# Patient Record
Sex: Female | Born: 1975 | Race: Black or African American | Hispanic: No | Marital: Single | Smoking: Never smoker
Health system: Southern US, Community
[De-identification: ages and names within clinical notes are randomized; demographics above are authoritative.]

## PROBLEM LIST (undated history)

## (undated) DIAGNOSIS — K219 Gastro-esophageal reflux disease without esophagitis: Secondary | ICD-10-CM

## (undated) HISTORY — PX: DILATION AND CURETTAGE OF UTERUS: SHX78

## (undated) HISTORY — PX: EYE SURGERY: SHX253

## (undated) HISTORY — PX: OTHER SURGICAL HISTORY: SHX169

---

## 2010-10-23 ENCOUNTER — Other Ambulatory Visit: Payer: Self-pay | Admitting: Obstetrics

## 2010-10-23 DIAGNOSIS — N83209 Unspecified ovarian cyst, unspecified side: Secondary | ICD-10-CM

## 2010-10-30 ENCOUNTER — Other Ambulatory Visit (HOSPITAL_COMMUNITY): Payer: Self-pay

## 2010-10-30 ENCOUNTER — Ambulatory Visit (HOSPITAL_COMMUNITY): Payer: Medicaid Other

## 2011-06-27 ENCOUNTER — Emergency Department (HOSPITAL_COMMUNITY): Payer: Medicaid Other

## 2011-06-27 ENCOUNTER — Encounter (HOSPITAL_COMMUNITY): Payer: Self-pay

## 2011-06-27 ENCOUNTER — Emergency Department (HOSPITAL_COMMUNITY)
Admission: EM | Admit: 2011-06-27 | Discharge: 2011-06-27 | Disposition: A | Discharge status: Home / Self Care | Payer: Medicaid Other | Attending: Emergency Medicine | Admitting: Emergency Medicine

## 2011-06-27 DIAGNOSIS — M545 Low back pain, unspecified: Secondary | ICD-10-CM | POA: Insufficient documentation

## 2011-06-27 DIAGNOSIS — M542 Cervicalgia: Secondary | ICD-10-CM | POA: Insufficient documentation

## 2011-06-27 DIAGNOSIS — J111 Influenza due to unidentified influenza virus with other respiratory manifestations: Secondary | ICD-10-CM

## 2011-06-27 DIAGNOSIS — R05 Cough: Secondary | ICD-10-CM | POA: Insufficient documentation

## 2011-06-27 DIAGNOSIS — R059 Cough, unspecified: Secondary | ICD-10-CM | POA: Insufficient documentation

## 2011-06-27 DIAGNOSIS — J3489 Other specified disorders of nose and nasal sinuses: Secondary | ICD-10-CM | POA: Insufficient documentation

## 2011-06-27 DIAGNOSIS — R509 Fever, unspecified: Secondary | ICD-10-CM | POA: Insufficient documentation

## 2011-06-27 DIAGNOSIS — R5381 Other malaise: Secondary | ICD-10-CM | POA: Insufficient documentation

## 2011-06-27 DIAGNOSIS — R5383 Other fatigue: Secondary | ICD-10-CM | POA: Insufficient documentation

## 2011-06-27 DIAGNOSIS — N390 Urinary tract infection, site not specified: Secondary | ICD-10-CM

## 2011-06-27 DIAGNOSIS — H9209 Otalgia, unspecified ear: Secondary | ICD-10-CM | POA: Insufficient documentation

## 2011-06-27 DIAGNOSIS — R51 Headache: Secondary | ICD-10-CM | POA: Insufficient documentation

## 2011-06-27 LAB — URINE MICROSCOPIC-ADD ON

## 2011-06-27 LAB — URINALYSIS, ROUTINE W REFLEX MICROSCOPIC
Ketones, ur: 15 mg/dL — AB
Nitrite: NEGATIVE
Protein, ur: NEGATIVE mg/dL
Urobilinogen, UA: 0.2 mg/dL (ref 0.0–1.0)
pH: 6 (ref 5.0–8.0)

## 2011-06-27 MED ORDER — ACETAMINOPHEN 325 MG PO TABS
650.0000 mg | ORAL_TABLET | Freq: Once | ORAL | Status: AC
  Administered 2011-06-27: 650 mg via ORAL
  Filled 2011-06-27: qty 2

## 2011-06-27 MED ORDER — SULFAMETHOXAZOLE-TRIMETHOPRIM 800-160 MG PO TABS: 1.0000 | ORAL_TABLET | Freq: Two times a day (BID) | ORAL | Status: AC

## 2011-06-27 NOTE — ED Notes (Signed)
MD at bedside. 

## 2011-06-27 NOTE — ED Notes (Signed)
Pt states that for the past few days she has been having cold s/s consistent with uri. She states that she took her temperature last night and it was 101. She states that she feels congested in her head and sinuses. She states that when she was at work and was moving her head and neck about she states that she had severe pain in her neck going up into her head. She was unable to sleep last night due to the malaise. Pt denies any nausea. She states that she felt way worse yesterday but what has changed today is the neck pain. She last took advil this morning at 7am.

## 2011-06-27 NOTE — ED Provider Notes (Signed)
History     CSN: [id number]  Arrival date & time 06/27/11  1205   First MD Initiated Contact with Patient 06/27/11 1226      Chief Complaint  Patient presents with  . Fever  . Neck Pain  . URI   patient essentially states that she began feeling sick 3 days ago on Tuesday. At that time she began having some ear discomfort nasal congestion, headache. She also had pain from her neck down into her lower back. She's had sinus congestion and also has had a cough. She has had generalized malaise and states the back. Pain is worse today. She denies any neck stiffness. She reports one documented fever. 3 days ago. She states many people at her work have been sick and her child is also been sick. She took Advil this morning. She has continued cough and also denies any dysuria or vaginal discharge. In triage. All vital signs are normal. Patient appears to be jovial. No acute distress  (Consider location/radiation/quality/duration/timing/severity/associated sxs/prior treatment) HPI  History reviewed. No pertinent past medical history.  Past Surgical History  Procedure Date  . Eye surgery   . Dilation and curettage of uterus     History reviewed. No pertinent family history.  History  Substance Use Topics  . Smoking status: Never Smoker   . Smokeless tobacco: Not on file  . Alcohol Use: No    OB History    Grav Para Term Preterm Abortions TAB SAB Ect Mult Living                  Review of Systems  All other systems reviewed and are negative.    Allergies  Review of patient's allergies indicates no known allergies.  Home Medications   Current Outpatient Rx  Name Route Sig Dispense Refill  . IBUPROFEN 200 MG PO TABS Oral Take 400 mg by mouth every 6 (six) hours as needed. For pain    . MEDROXYPROGESTERONE ACETATE 150 MG/ML IM SUSP Intramuscular Inject 150 mg into the muscle every 3 (three) months.      BP 121/72  Pulse 74  Temp(Src) 98.3 F (36.8 C) (Oral)  Resp 16   SpO2 99%  LMP 06/09/2011  Physical Exam  Nursing note and vitals reviewed. Constitutional: She is oriented to person, place, and time. She appears well-developed and well-nourished.       Calm comfortable, and cooperative, in no acute distress  HENT:  Head: Normocephalic and atraumatic.  Right Ear: External ear normal.  Left Ear: External ear normal.  Nose: Nose normal.  Mouth/Throat: Oropharynx is clear and moist. No oropharyngeal exudate.       Minimal nasal congestion. There is no sinus tenderness  Eyes: Conjunctivae and EOM are normal. Pupils are equal, round, and reactive to light. Left eye exhibits no discharge. No scleral icterus.  Neck: Neck supple. No tracheal deviation present. No thyromegaly present.       Negative jolt extenuation tests. Supple. Full range of motion. No swelling or adenopathy. Mostly tender around the bilateral trapezius and para spinal   musculature of the upper back.  Also, diffusely tender in the lower back.  Cardiovascular: Normal rate and regular rhythm.  Exam reveals no gallop and no friction rub.   No murmur heard. Pulmonary/Chest: Breath sounds normal. She has no wheezes. She has no rales. She exhibits no tenderness.  Abdominal: Soft. Bowel sounds are normal. She exhibits no distension. There is no tenderness. There is no rebound and no guarding.  Musculoskeletal: Normal range of motion.  Lymphadenopathy:    She has no cervical adenopathy.  Neurological: She is alert and oriented to person, place, and time. No cranial nerve deficit. Coordination normal.       No meningismus. Motor strength is normal.  Skin: Skin is warm and dry. No rash noted. No erythema.  Psychiatric: She has a normal mood and affect.    ED Course  Procedures (including critical care time)   Labs Reviewed  URINALYSIS, ROUTINE W REFLEX MICROSCOPIC   No results found.   No diagnosis found.    MDM  Patient is seen and examined, initial history and physical is completed.  Evaluation initiated     Will give Tylenol and check chest x-ray and UA. Symptoms sound concerning for possible influenza or influenza-like illness. Although she does have some neck pain. There is also diffuse back pain, and myalgias with no sign of any neck stiffness. Minimal if any suspicion for meningitis. Patient very nontoxic in appearance     Medications  ibuprofen (ADVIL) 200 MG tablet (not administered)  medroxyPROGESTERone (DEPO-PROVERA) 150 MG/ML injection (not administered)  sulfamethoxazole-trimethoprim (SEPTRA DS) 800-160 MG per tablet (not administered)  acetaminophen (TYLENOL) tablet 650 mg (650 mg Oral Given 06/27/11 1247)     Results for orders placed during the hospital encounter of 06/27/11  URINALYSIS, ROUTINE W REFLEX MICROSCOPIC      Component Value Range   Color, Urine YELLOW  YELLOW    APPearance CLOUDY (*) CLEAR    Specific Gravity, Urine 1.036 (*) 1.005 - 1.030    pH 6.0  5.0 - 8.0    Glucose, UA NEGATIVE  NEGATIVE (mg/dL)   Hgb urine dipstick SMALL (*) NEGATIVE    Bilirubin Urine SMALL (*) NEGATIVE    Ketones, ur 15 (*) NEGATIVE (mg/dL)   Protein, ur NEGATIVE  NEGATIVE (mg/dL)   Urobilinogen, UA 0.2  0.0 - 1.0 (mg/dL)   Nitrite NEGATIVE  NEGATIVE    Leukocytes, UA TRACE (*) NEGATIVE   POCT PREGNANCY, URINE      Component Value Range   Preg Test, Ur NEGATIVE  NEGATIVE   URINE MICROSCOPIC-ADD ON      Component Value Range   Squamous Epithelial / LPF MANY (*) RARE    WBC, UA 0-2  <3 (WBC/hpf)   RBC / HPF 3-6  <3 (RBC/hpf)   Bacteria, UA FEW (*) RARE    Urine-Other MUCOUS PRESENT     Dg Chest 2 View  06/27/2011  *RADIOLOGY REPORT*  Clinical Data: Fever and cough  CHEST - 2 VIEW  Comparison: None  Findings:  Mild cardiac enlargement noted.  No pleural effusion or pulmonary edema.  No airspace consolidation identified.  Review of the visualized osseous structures is significant for mild lower thoracic spine degenerative disc disease.  IMPRESSION:  1.   No active cardiopulmonary abnormalities.  Original Report Authenticated By: Rosealee Albee, M.D.      We'll treat for urinary tract infection, as well as for influenza-like illness. Patient will be discharged home in stable improved condition with specific instructions. She is also requesting a work note which we have given her.   Monice Lundy A. Patrica Duel, MD 06/27/11 1341

## 2011-10-07 ENCOUNTER — Other Ambulatory Visit: Payer: Medicaid Other

## 2011-10-07 ENCOUNTER — Encounter: Payer: Self-pay | Admitting: Family Medicine

## 2011-10-07 ENCOUNTER — Ambulatory Visit: Payer: Medicaid Other

## 2011-10-07 ENCOUNTER — Ambulatory Visit (INDEPENDENT_AMBULATORY_CARE_PROVIDER_SITE_OTHER): Payer: Medicaid Other | Admitting: Family Medicine

## 2011-10-07 VITALS — BP 128/74 | HR 68 | Temp 98.0°F | Ht 64.0 in | Wt 206.1 lb

## 2011-10-07 DIAGNOSIS — E669 Obesity, unspecified: Secondary | ICD-10-CM

## 2011-10-07 DIAGNOSIS — Z111 Encounter for screening for respiratory tuberculosis: Secondary | ICD-10-CM

## 2011-10-07 DIAGNOSIS — F439 Reaction to severe stress, unspecified: Secondary | ICD-10-CM | POA: Insufficient documentation

## 2011-10-07 DIAGNOSIS — Z23 Encounter for immunization: Secondary | ICD-10-CM

## 2011-10-07 DIAGNOSIS — Z733 Stress, not elsewhere classified: Secondary | ICD-10-CM

## 2011-10-07 DIAGNOSIS — K219 Gastro-esophageal reflux disease without esophagitis: Secondary | ICD-10-CM | POA: Insufficient documentation

## 2011-10-07 DIAGNOSIS — B354 Tinea corporis: Secondary | ICD-10-CM

## 2011-10-07 DIAGNOSIS — Z0000 Encounter for general adult medical examination without abnormal findings: Secondary | ICD-10-CM | POA: Insufficient documentation

## 2011-10-07 MED ORDER — OMEPRAZOLE 20 MG PO TBEC: 20.0000 mg | DELAYED_RELEASE_TABLET | Freq: Every day | ORAL | Status: DC

## 2011-10-07 MED ORDER — TUBERCULIN PPD 5 UNIT/0.1ML ID SOLN
5.0000 [IU] | Freq: Once | INTRADERMAL | Status: AC
  Administered 2011-10-07: 5 [IU] via INTRADERMAL

## 2011-10-07 MED ORDER — TERBINAFINE HCL 250 MG PO TABS: 250.0000 mg | ORAL_TABLET | Freq: Every day | ORAL | Status: DC

## 2011-10-07 MED ORDER — TETANUS-DIPHTH-ACELL PERTUSSIS 5-2.5-18.5 LF-MCG/0.5 IM SUSP
0.5000 mL | Freq: Once | INTRAMUSCULAR | Status: AC
  Administered 2011-10-07: 0.5 mL via INTRAMUSCULAR
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
## 2011-10-07 NOTE — Assessment & Plan Note (Signed)
Had annual gyn exam at [facility] in jan 2013.  Advised will follow-up in 1 year or sooner if needed.  Given TDAP and PPD today for school form.  

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
## 2011-10-07 NOTE — Patient Instructions (Addendum)
Make appointment for nurse visit for TB test check and labwork for fasting bloodwork in 48-72 hours Call and find records of your Hepatitis B and MMR Please contact Dr. Pascal Lux, clinical psychologist, to set up an appointment.  She can be reached at [phone].   Workup to exercising 30 minutes 5 days a week- can help with mood, weight, stress. Good luck with school!  Diet for GERD or PUD Nutrition therapy can help ease the discomfort of gastroesophageal reflux disease (GERD) and peptic ulcer disease (PUD).   HOME CARE INSTRUCTIONS    Eat your meals slowly, in a relaxed setting.   Eat 5 to 6 small meals per day.   If a food causes distress, stop eating it for a period of time.  FOODS TO AVOID  Coffee, regular or decaffeinated.   Cola beverages, regular or low calorie.   Tea, regular or decaffeinated.   Pepper.   Cocoa.   High fat foods, including meats.   Butter, margarine, hydrogenated oil (trans fats).   Peppermint or spearmint (if you have GERD).   Fruits and vegetables if not tolerated.   Alcohol.   Nicotine (smoking or chewing). This is one of the most potent stimulants to acid production in the gastrointestinal tract.   Any food that seems to aggravate your condition.  If you have questions regarding your diet, ask your caregiver or a registered dietitian. TIPS  Lying flat may make symptoms worse. Keep the head of your bed raised 6 to 9 inches (15 to 23 cm) by using a foam wedge or blocks under the legs of the bed.   Do not lay down until 3 hours after eating a meal.   Daily physical activity may help reduce symptoms.  MAKE SURE YOU:    Understand these instructions.   Will watch your condition.   Will get help right away if you are not doing well or get worse.  Document Released: 05/13/2005 Document Revised: 05/02/2011 Document Reviewed: 03/29/2011 [facility] Patient Information 2012 [place], [place].

## 2011-10-07 NOTE — Assessment & Plan Note (Addendum)
Difficult to reach area of upper back and lower skin folds of back.  Terbinafine x 10 days, and advised can treat occ flares with staying dry, lamisil, may try selsun blue washes if component of malessezia may help.

## 2011-10-07 NOTE — Assessment & Plan Note (Addendum)
Discussed daily exercise, will check DM, lipids.  TSH due to infrequent stools, and family history of hypothyroidism.

## 2011-10-07 NOTE — Assessment & Plan Note (Signed)
Discussed dietary and behavioral modifications.  rx for omeprazole.

## 2011-10-10 ENCOUNTER — Other Ambulatory Visit: Payer: Medicaid Other

## 2011-10-10 ENCOUNTER — Ambulatory Visit (INDEPENDENT_AMBULATORY_CARE_PROVIDER_SITE_OTHER): Payer: Medicaid Other | Admitting: *Deleted

## 2011-10-10 DIAGNOSIS — Z111 Encounter for screening for respiratory tuberculosis: Secondary | ICD-10-CM

## 2011-10-10 DIAGNOSIS — IMO0001 Reserved for inherently not codable concepts without codable children: Secondary | ICD-10-CM

## 2011-10-10 DIAGNOSIS — E669 Obesity, unspecified: Secondary | ICD-10-CM

## 2011-10-10 LAB — COMPREHENSIVE METABOLIC PANEL
ALT: 14 U/L (ref 0–35)
Albumin: 4.2 g/dL (ref 3.5–5.2)
CO2: 23 mEq/L (ref 19–32)
Calcium: 9.4 mg/dL (ref 8.4–10.5)
Chloride: 112 mEq/L (ref 96–112)
Potassium: 4.3 mEq/L (ref 3.5–5.3)
Sodium: 142 mEq/L (ref 135–145)
Total Protein: 6.9 g/dL (ref 6.0–8.3)

## 2011-10-10 LAB — LIPID PANEL: Cholesterol: 155 mg/dL (ref 0–200)

## 2011-10-10 NOTE — Progress Notes (Signed)
PPD negative , 0 induration. Patient will bring in form that needs completing for school .

## 2011-10-10 NOTE — Progress Notes (Signed)
CMP,FLP AND TSH DONE TODAY Kim Phelps 

## 2011-10-11 ENCOUNTER — Encounter: Payer: Self-pay | Admitting: Family Medicine

## 2011-10-24 ENCOUNTER — Encounter: Payer: Self-pay | Admitting: Family Medicine

## 2011-10-24 ENCOUNTER — Ambulatory Visit (INDEPENDENT_AMBULATORY_CARE_PROVIDER_SITE_OTHER): Payer: Medicaid Other | Admitting: Family Medicine

## 2011-10-24 VITALS — BP 113/74 | HR 87 | Temp 98.7°F | Ht 64.0 in | Wt 205.9 lb

## 2011-10-24 DIAGNOSIS — B354 Tinea corporis: Secondary | ICD-10-CM

## 2011-10-24 MED ORDER — KETOCONAZOLE 2 % EX SHAM: MEDICATED_SHAMPOO | CUTANEOUS | Status: AC

## 2011-10-24 MED ORDER — TERBINAFINE HCL 250 MG PO TABS: 250.0000 mg | ORAL_TABLET | Freq: Every day | ORAL | Status: DC

## 2011-10-24 NOTE — Patient Instructions (Signed)
Take 10 days of terbinafine  Use ketoconazole shampoo-- use it like a lotion, apply it to your back and let it stay for at least 20 minutes.  Ok to sleep in it overnight if it doesn't make your skin irritated.  Test a small area first.  Use selsun blue with selenium sulfide as a body wash daily

## 2011-10-24 NOTE — Progress Notes (Signed)
  Subjective:    Patient ID: Kim Phelps, female    DOB: [birth year], 36 y.o.   MRN: [id number]  HPI 36 yo here for follow-up of rash  tx for tinea corporis with 10 day course of lamisil- it helped but is returning.  Is itchy.  Sweats a lot. Takes showers twice a day.  Has been present since adolescence. Review of Systemssee HPI     Objective:   Physical Exam GEN: NAD Skin:  Large pink patches on back, fluouresces.     Assessment & Plan:

## 2011-10-24 NOTE — Assessment & Plan Note (Signed)
Possible more consistent with tinea versicolor today.  Will refill terbinafine x 10 more days.  Will also rx ketoconazole shampoo to use as a topical lotion 1-2 times weekly at night.  Will use selsun blue as a daily soap.

## 2011-11-04 ENCOUNTER — Encounter: Payer: Self-pay | Admitting: Family Medicine

## 2011-12-04 ENCOUNTER — Ambulatory Visit (INDEPENDENT_AMBULATORY_CARE_PROVIDER_SITE_OTHER): Payer: Medicaid Other | Admitting: *Deleted

## 2011-12-04 DIAGNOSIS — Z309 Encounter for contraceptive management, unspecified: Secondary | ICD-10-CM

## 2011-12-05 MED ORDER — MEDROXYPROGESTERONE ACETATE 150 MG/ML IM SUSP
150.0000 mg | Freq: Once | INTRAMUSCULAR | Status: AC
  Administered 2011-12-04: 150 mg via INTRAMUSCULAR

## 2011-12-05 NOTE — Progress Notes (Signed)
Patient in for Depo Provera. Received records from [facility].  Last Depo given there was 09/11/2011.

## 2011-12-16 ENCOUNTER — Encounter (HOSPITAL_COMMUNITY): Payer: Self-pay | Admitting: *Deleted

## 2011-12-16 ENCOUNTER — Emergency Department (HOSPITAL_COMMUNITY)
Admission: EM | Admit: 2011-12-16 | Discharge: 2011-12-16 | Disposition: A | Discharge status: Home / Self Care | Payer: Medicaid Other | Source: Home / Self Care | Attending: Emergency Medicine | Admitting: Emergency Medicine

## 2011-12-16 DIAGNOSIS — M722 Plantar fascial fibromatosis: Secondary | ICD-10-CM

## 2011-12-16 HISTORY — DX: Gastro-esophageal reflux disease without esophagitis: K21.9

## 2011-12-16 MED ORDER — MELOXICAM 7.5 MG PO TABS: 7.5000 mg | ORAL_TABLET | Freq: Every day | ORAL | Status: DC

## 2011-12-16 NOTE — ED Provider Notes (Signed)
History     CSN: [id number]  Arrival date & time 12/16/11  1111   First MD Initiated Contact with Patient 12/16/11 1127      Chief Complaint  Patient presents with  . Foot Pain    (Consider location/radiation/quality/duration/timing/severity/associated sxs/prior treatment) HPI Comments: Patient presents urgent care today complaining of left heel pain since Saturday. She denies any recent falls or injuries or any changes in her recent physical activities. She denies any numbness, tingling sensation or weakness distally or proximally to her left heel. She's been so when her foot in Epsom salts. This morning when she was at work or just by walking and was very tender. She does not think she he stepped into anything, or have noticed any changes to her skin.  Patient is a 36 y.o. female presenting with lower extremity pain. The history is provided by the patient.  Foot Pain This is a new problem. The current episode started more than 2 days ago. The problem occurs constantly. The problem has not changed since onset.The symptoms are aggravated by walking. The symptoms are relieved by rest. She has tried nothing for the symptoms. The treatment provided no relief.    Past Medical History  Diagnosis Date  . Acid reflux     Past Surgical History  Procedure Date  . Eye surgery   . Dilation and curettage of uterus   . Right cataract     congential  . Cesarean section     Family History  Problem Relation Age of Onset  . Hypertension Mother   . Hypothyroidism Mother   . Cancer Maternal Grandmother 60    breast ca  . Diabetes Neg Hx   . Heart disease Neg Hx   . Stroke Neg Hx     History  Substance Use Topics  . Smoking status: Never Smoker   . Smokeless tobacco: Not on file  . Alcohol Use: No    OB History    Grav Para Term Preterm Abortions TAB SAB Ect Mult Living                  Review of Systems  Constitutional: Positive for activity change. Negative for chills and  appetite change.  Musculoskeletal: Positive for gait problem. Negative for myalgias, back pain, joint swelling and arthralgias.  Skin: Negative for color change, pallor, rash and wound.  Neurological: Negative for weakness and numbness.    Allergies  Review of patient's allergies indicates no known allergies.  Home Medications   Current Outpatient Rx  Name Route Sig Dispense Refill  . IBUPROFEN 200 MG PO TABS Oral Take 800 mg by mouth every 6 (six) hours as needed. For pain    . MEDROXYPROGESTERONE ACETATE 150 MG/ML IM SUSP Intramuscular Inject 150 mg into the muscle every 3 (three) months. [employer]    . OMEPRAZOLE 20 MG PO TBEC Oral Take 1 tablet (20 mg total) by mouth daily. 30 each 5  . MELOXICAM 7.5 MG PO TABS Oral Take 1 tablet (7.5 mg total) by mouth daily. 14 tablet 0  . TERBINAFINE HCL 250 MG PO TABS Oral Take 1 tablet (250 mg total) by mouth daily. 10 tablet 0    BP 110/57  Pulse 64  Temp 98.5 F (36.9 C) (Oral)  Resp 16  SpO2 100%  LMP 12/09/2011  Physical Exam  Nursing note and vitals reviewed. Constitutional: She appears well-developed and well-nourished.  Musculoskeletal: She exhibits tenderness.       Left foot: She exhibits  tenderness. She exhibits normal range of motion, no bony tenderness, no swelling, no crepitus, no deformity and no laceration.       Feet:  Neurological: She is alert.  Skin: Skin is warm. No rash noted. No erythema.    ED Course  Procedures (including critical care time)  Labs Reviewed - No data to display No results found.   1. Plantar fasciitis, left       MDM   Localize fasciitis is calcaneal area. Patient was instructed to return if worsening in the next 5-7 days to consider x-rays at this point her history and exam does not suggest the possibility of a calcaneal fracture or dislocation. Have encouraged patient to take meloxicam course printed materials for plantar fasciitis and keep her foot elevated for 2-3 nights is much as  possible. Encourage her to followup with a [facility] if no improvement after 2 weeks for customized plantar insert       Jimmie Molly, MD 12/16/11 1309

## 2011-12-16 NOTE — ED Notes (Signed)
Per pt pain left heel onset Saturday am upon getting out of bed - soaking foot in epsom salts - worse this am while at work - unable to put any pressure on heel - no known injury

## 2012-01-07 ENCOUNTER — Ambulatory Visit (INDEPENDENT_AMBULATORY_CARE_PROVIDER_SITE_OTHER): Payer: Medicaid Other | Admitting: Family Medicine

## 2012-01-07 DIAGNOSIS — S0100XA Unspecified open wound of scalp, initial encounter: Secondary | ICD-10-CM

## 2012-01-07 MED ORDER — DOXYCYCLINE HYCLATE 100 MG PO TABS: 100.0000 mg | ORAL_TABLET | Freq: Two times a day (BID) | ORAL | Status: AC

## 2012-01-07 NOTE — Patient Instructions (Addendum)
Doxycycline for skin infection  Wash area gently with warm water and antibacterial soap  Come back if worsening, fevers, chills, or other concerns

## 2012-01-08 DIAGNOSIS — S0100XA Unspecified open wound of scalp, initial encounter: Secondary | ICD-10-CM | POA: Insufficient documentation

## 2012-01-08 NOTE — Assessment & Plan Note (Signed)
No evidence of abscess or cyst.  Likely mild cellulitis due to recent trauma.  Will treat with doxycycline, advised patient to return for re-evaluation if worsening or does not improve.

## 2012-01-08 NOTE — Progress Notes (Signed)
  Subjective:    Patient ID: Kim Phelps, female    DOB: [birth year], 36 y.o.   MRN: [id number]  HPI Here for evaluation of scalp pain  Hair extensions placed several weeks ago, thinks was sewn into her scalp.  2 days ago she removed extensions.  Now notices a scab and tenderness at the area.  Very sore to the touch.  Does not know if any bleeding or drainage but hair around area crusted.  No fever, chills.  I have reviewed patient's  PMH, FH, and Social history and Medications as related to this visit. Had tetanus shot in May 2013 Review of Systems See HPI    Objective:   Physical Exam GEN: NAD Scalp:  Two bumps over right occipital scalp (approx 2 cm), no fluctuance or erythema.  Tender to palpation.  One with crusting around hair which is matted.  Looks somewhat like seborrhea.       Assessment & Plan:

## 2012-02-20 ENCOUNTER — Ambulatory Visit (INDEPENDENT_AMBULATORY_CARE_PROVIDER_SITE_OTHER): Payer: Medicaid Other | Admitting: *Deleted

## 2012-02-20 DIAGNOSIS — Z309 Encounter for contraceptive management, unspecified: Secondary | ICD-10-CM

## 2012-02-20 MED ORDER — MEDROXYPROGESTERONE ACETATE 150 MG/ML IM SUSP
150.0000 mg | Freq: Once | INTRAMUSCULAR | Status: AC
  Administered 2012-02-20: 150 mg via INTRAMUSCULAR

## 2012-03-18 ENCOUNTER — Ambulatory Visit: Payer: Medicaid Other | Admitting: Family Medicine

## 2012-03-18 ENCOUNTER — Ambulatory Visit (INDEPENDENT_AMBULATORY_CARE_PROVIDER_SITE_OTHER): Payer: Medicaid Other | Admitting: Family Medicine

## 2012-03-18 ENCOUNTER — Encounter: Payer: Self-pay | Admitting: Family Medicine

## 2012-03-18 VITALS — BP 125/72 | HR 86 | Temp 99.4°F | Ht 64.0 in | Wt 211.0 lb

## 2012-03-18 DIAGNOSIS — H109 Unspecified conjunctivitis: Secondary | ICD-10-CM

## 2012-03-18 DIAGNOSIS — G5600 Carpal tunnel syndrome, unspecified upper limb: Secondary | ICD-10-CM

## 2012-03-18 DIAGNOSIS — G5603 Carpal tunnel syndrome, bilateral upper limbs: Secondary | ICD-10-CM

## 2012-03-18 NOTE — Patient Instructions (Addendum)
Fill your prescription for wrist brace at  [facility] [address]  [place], [place] [address] </DIV>

## 2012-03-19 DIAGNOSIS — H109 Unspecified conjunctivitis: Secondary | ICD-10-CM | POA: Insufficient documentation

## 2012-03-19 NOTE — Assessment & Plan Note (Signed)
Discussed options.  Will start with wrist splints.  Rx given.

## 2012-03-19 NOTE — Assessment & Plan Note (Signed)
Viral, expectant observation

## 2012-03-19 NOTE — Progress Notes (Signed)
  Subjective:    Patient ID: Kim Phelps, female    DOB: 1975/07/18, 36 y.o.   MRN: [id number]  HPI Two issues: Pink eye Lt>Rt x3 days.  Not worsening.  Eyes itching, no pain, no change in vision.  Minimal DC in the morning.  No photophobia Bilateral carpal tunnel carries this diagnosis although was not on problem list.  Both sides significantly affected with night pain to elbow, numbness.  She states that she may have weakness.    Review of Systems     Objective:   Physical Exam Eyes, conjunctiva injected.  No perilimbal redness.  Media clear Pos phallens and tinnels.  No atrophy of thenar eminence.  Good strength.        Assessment & Plan:

## 2012-05-07 ENCOUNTER — Ambulatory Visit (INDEPENDENT_AMBULATORY_CARE_PROVIDER_SITE_OTHER): Payer: Medicaid Other | Admitting: *Deleted

## 2012-05-07 DIAGNOSIS — Z309 Encounter for contraceptive management, unspecified: Secondary | ICD-10-CM

## 2012-05-07 MED ORDER — MEDROXYPROGESTERONE ACETATE 150 MG/ML IM SUSP
150.0000 mg | Freq: Once | INTRAMUSCULAR | Status: AC
  Administered 2012-05-07: 150 mg via INTRAMUSCULAR

## 2012-05-07 NOTE — Progress Notes (Signed)
Next Depo due Feb 27 through August 06, 2012. Reminder card given.

## 2012-05-07 NOTE — Progress Notes (Signed)
In for Depo today and reports episodes of chest discomfort for past 2 months off and on.  Had one episode 2 months ago then a month later had episode again  Now has about once  a week . Describes pain as a cramping sensation.  Has to take short breathes. Last about 1-2 minutes. .   Had episode at Thanksgiving and then again night before last. Denies any discomfort now. States she has BM about three times a week and this concerns her that it may be a contributing cause of chest discomfort. Consulted with Dr. [place] and she advises to schedule appointment tomorrow to be evaluated.  Advised patient to go to ED if this recurs.

## 2012-05-08 ENCOUNTER — Ambulatory Visit: Payer: Medicaid Other | Admitting: Family Medicine

## 2012-06-16 ENCOUNTER — Encounter: Payer: Medicaid Other | Admitting: Family Medicine

## 2012-06-22 ENCOUNTER — Encounter: Payer: Self-pay | Admitting: *Deleted

## 2012-06-29 ENCOUNTER — Ambulatory Visit (INDEPENDENT_AMBULATORY_CARE_PROVIDER_SITE_OTHER): Payer: Medicaid Other | Admitting: *Deleted

## 2012-06-29 VITALS — Temp 98.5°F

## 2012-06-29 DIAGNOSIS — Z23 Encounter for immunization: Secondary | ICD-10-CM

## 2012-07-23 ENCOUNTER — Ambulatory Visit (INDEPENDENT_AMBULATORY_CARE_PROVIDER_SITE_OTHER): Payer: Medicaid Other | Admitting: *Deleted

## 2012-07-23 DIAGNOSIS — Z309 Encounter for contraceptive management, unspecified: Secondary | ICD-10-CM

## 2012-07-23 NOTE — Progress Notes (Signed)
Next depo due May 15 thru Oct 22, 2012. 

## 2012-07-24 MED ORDER — MEDROXYPROGESTERONE ACETATE 150 MG/ML IM SUSP
150.0000 mg | Freq: Once | INTRAMUSCULAR | Status: AC
  Administered 2012-07-23: 150 mg via INTRAMUSCULAR

## 2012-09-16 ENCOUNTER — Encounter: Payer: Self-pay | Admitting: Family Medicine

## 2012-09-16 ENCOUNTER — Ambulatory Visit (INDEPENDENT_AMBULATORY_CARE_PROVIDER_SITE_OTHER): Payer: Self-pay | Admitting: Family Medicine

## 2012-09-16 VITALS — BP 131/81 | HR 81 | Temp 98.2°F | Ht 64.0 in | Wt 224.0 lb

## 2012-09-16 DIAGNOSIS — M25562 Pain in left knee: Secondary | ICD-10-CM | POA: Insufficient documentation

## 2012-09-16 DIAGNOSIS — M25569 Pain in unspecified knee: Secondary | ICD-10-CM

## 2012-09-16 NOTE — Assessment & Plan Note (Signed)
Possible arthritis, but suspect meniscal injury given more acute presentation, age and severity of pain. Patient to conitune NSAIDS, will bring her medicaid card back and we willrefer to orthopedics for further evaluation/imaging.

## 2012-09-16 NOTE — Patient Instructions (Addendum)
Bring your medicaid card in when you get it and we will refer you to orthopedics  Due for physical end of may and fasting labwork

## 2012-09-16 NOTE — Progress Notes (Signed)
  Subjective:    Patient ID: Kim Phelps, female    DOB: 28-Jul-1975, 37 y.o.   MRN: [id number]  HPI 3-4 weeks of left knee pain.   No known injury or strain.  Sudden onset upon awakening one morning with mild pain, which worsened gradually.  Did improve with ace bandage, cold compresses, ibuprofen, but then got worse.  Worse with walking and steps at the end of the day.  No popping, locking, giving out.  Feels like knee is grinding.  I have reviewed patient's  PMH, FH, and Social history and Medications as related to this visit. No history of knee injury or surgeries.  Review of Systems See HPI    Objective:   Physical Exam  GEN: Alert & Oriented, No acute distress, obese Knee: Inspection: minimal suprapatellar swelling, no erythema Palpation with bilateral joint line tenderness.  No tenderness over patella. ROM normal in flexion and extension although extension elicits pain both medially and laterally. Ligaments with solid consistent endpoints. Non painful patellar compression.        Assessment & Plan:

## 2012-10-16 ENCOUNTER — Encounter: Payer: Self-pay | Admitting: Family Medicine

## 2012-10-21 ENCOUNTER — Encounter: Payer: Self-pay | Admitting: Family Medicine

## 2012-10-21 ENCOUNTER — Ambulatory Visit (INDEPENDENT_AMBULATORY_CARE_PROVIDER_SITE_OTHER): Payer: Medicaid Other | Admitting: Family Medicine

## 2012-10-21 ENCOUNTER — Other Ambulatory Visit (HOSPITAL_COMMUNITY)
Admission: RE | Admit: 2012-10-21 | Discharge: 2012-10-21 | Disposition: A | Discharge status: Home / Self Care | Payer: Medicaid Other | Source: Ambulatory Visit | Attending: Family Medicine | Admitting: Family Medicine

## 2012-10-21 VITALS — BP 121/79 | HR 103 | Temp 98.6°F | Ht 64.0 in | Wt 227.0 lb

## 2012-10-21 DIAGNOSIS — Z124 Encounter for screening for malignant neoplasm of cervix: Secondary | ICD-10-CM

## 2012-10-21 DIAGNOSIS — Z111 Encounter for screening for respiratory tuberculosis: Secondary | ICD-10-CM

## 2012-10-21 DIAGNOSIS — Z113 Encounter for screening for infections with a predominantly sexual mode of transmission: Secondary | ICD-10-CM

## 2012-10-21 DIAGNOSIS — K219 Gastro-esophageal reflux disease without esophagitis: Secondary | ICD-10-CM

## 2012-10-21 DIAGNOSIS — Z309 Encounter for contraceptive management, unspecified: Secondary | ICD-10-CM

## 2012-10-21 DIAGNOSIS — Z0000 Encounter for general adult medical examination without abnormal findings: Secondary | ICD-10-CM

## 2012-10-21 DIAGNOSIS — Z23 Encounter for immunization: Secondary | ICD-10-CM

## 2012-10-21 DIAGNOSIS — Z1151 Encounter for screening for human papillomavirus (HPV): Secondary | ICD-10-CM | POA: Insufficient documentation

## 2012-10-21 DIAGNOSIS — E669 Obesity, unspecified: Secondary | ICD-10-CM

## 2012-10-21 DIAGNOSIS — Z01419 Encounter for gynecological examination (general) (routine) without abnormal findings: Secondary | ICD-10-CM | POA: Insufficient documentation

## 2012-10-21 LAB — LIPID PANEL
HDL: 48 mg/dL (ref >39)
LDL Cholesterol: 109 mg/dL — ABNORMAL HIGH (ref 0–99)
VLDL: 20 mg/dL (ref 0–40)

## 2012-10-21 LAB — COMPREHENSIVE METABOLIC PANEL
AST: 21 U/L (ref 0–37)
Albumin: 4.3 g/dL (ref 3.5–5.2)
Alkaline Phosphatase: 61 U/L (ref 39–117)
BUN: 13 mg/dL (ref 6–23)
Potassium: 4.1 mEq/L (ref 3.5–5.3)
Sodium: 138 mEq/L (ref 135–145)
Total Bilirubin: 0.4 mg/dL (ref 0.3–1.2)
Total Protein: 7.7 g/dL (ref 6.0–8.3)

## 2012-10-21 LAB — TSH: TSH: 2.689 u[IU]/mL (ref 0.350–4.500)

## 2012-10-21 LAB — CBC
MCHC: 34.8 g/dL (ref 30.0–36.0)
RDW: 13.6 % (ref 11.5–15.5)

## 2012-10-21 MED ORDER — MEDROXYPROGESTERONE ACETATE 150 MG/ML IM SUSP
150.0000 mg | Freq: Once | INTRAMUSCULAR | Status: AC
  Administered 2012-10-21: 150 mg via INTRAMUSCULAR

## 2012-10-21 MED ORDER — OMEPRAZOLE 20 MG PO TBEC: 20.0000 mg | DELAYED_RELEASE_TABLET | Freq: Every day | ORAL | Status: AC

## 2012-10-21 NOTE — Patient Instructions (Signed)
Preventive Care for Adults, Female A healthy lifestyle and preventive care can promote health and wellness. Preventive health guidelines for women include the following key practices.  A routine yearly physical is a good way to check with your caregiver about your health and preventive screening. It is a chance to share any concerns and updates on your health, and to receive a thorough exam.  Visit your dentist for a routine exam and preventive care every 6 months. Brush your teeth twice a day and floss once a day. Good oral hygiene prevents tooth decay and gum disease.  The frequency of eye exams is based on your age, health, family medical history, use of contact lenses, and other factors. Follow your caregiver's recommendations for frequency of eye exams.  Eat a healthy diet. Foods like vegetables, fruits, whole grains, low-fat dairy products, and lean protein foods contain the nutrients you need without too many calories. Decrease your intake of foods high in solid fats, added sugars, and salt. Eat the right amount of calories for you.Get information about a proper diet from your caregiver, if necessary.  Regular physical exercise is one of the most important things you can do for your health. Most adults should get at least 150 minutes of moderate-intensity exercise (any activity that increases your heart rate and causes you to sweat) each week. In addition, most adults need muscle-strengthening exercises on 2 or more days a week.  Maintain a healthy weight. The body mass index (BMI) is a screening tool to identify possible weight problems. It provides an estimate of body fat based on height and weight. Your caregiver can help determine your BMI, and can help you achieve or maintain a healthy weight.For adults 20 years and older:  A BMI below 18.5 is considered underweight.  A BMI of 18.5 to 24.9 is normal.  A BMI of 25 to 29.9 is considered overweight.  A BMI of 30 and above is  considered obese.  Maintain normal blood lipids and cholesterol levels by exercising and minimizing your intake of saturated fat. Eat a balanced diet with plenty of fruit and vegetables. Blood tests for lipids and cholesterol should begin at age 20 and be repeated every 5 years. If your lipid or cholesterol levels are high, you are over 50, or you are at high risk for heart disease, you may need your cholesterol levels checked more frequently.Ongoing high lipid and cholesterol levels should be treated with medicines if diet and exercise are not effective.  If you smoke, find out from your caregiver how to quit. If you do not use tobacco, do not start.  If you are pregnant, do not drink alcohol. If you are breastfeeding, be very cautious about drinking alcohol. If you are not pregnant and choose to drink alcohol, do not exceed 1 drink per day. One drink is considered to be 12 ounces (355 mL) of beer, 5 ounces (148 mL) of wine, or 1.5 ounces (44 mL) of liquor.  Avoid use of street drugs. Do not share needles with anyone. Ask for help if you need support or instructions about stopping the use of drugs.  High blood pressure causes heart disease and increases the risk of stroke. Your blood pressure should be checked at least every 1 to 2 years. Ongoing high blood pressure should be treated with medicines if weight loss and exercise are not effective.  If you are 55 to 35 years old, ask your caregiver if you should take aspirin to prevent strokes.  Diabetes   screening involves taking a blood sample to check your fasting blood sugar level. This should be done once every 3 years, after age 45, if you are within normal weight and without risk factors for diabetes. Testing should be considered at a younger age or be carried out more frequently if you are overweight and have at least 1 risk factor for diabetes.  Breast cancer screening is essential preventive care for women. You should practice "breast  self-awareness." This means understanding the normal appearance and feel of your breasts and may include breast self-examination. Any changes detected, no matter how small, should be reported to a caregiver. Women in their 20s and 30s should have a clinical breast exam (CBE) by a caregiver as part of a regular health exam every 1 to 3 years. After age 40, women should have a CBE every year. Starting at age 40, women should consider having a mammography (breast X-ray test) every year. Women who have a family history of breast cancer should talk to their caregiver about genetic screening. Women at a high risk of breast cancer should talk to their caregivers about having magnetic resonance imaging (MRI) and a mammography every year.  The Pap test is a screening test for cervical cancer. A Pap test can show cell changes on the cervix that might become cervical cancer if left untreated. A Pap test is a procedure in which cells are obtained and examined from the lower end of the uterus (cervix).  Women should have a Pap test starting at age 21.  Between ages 21 and 29, Pap tests should be repeated every 2 years.  Beginning at age 30, you should have a Pap test every 3 years as long as the past 3 Pap tests have been normal.  Some women have medical problems that increase the chance of getting cervical cancer. Talk to your caregiver about these problems. It is especially important to talk to your caregiver if a new problem develops soon after your last Pap test. In these cases, your caregiver may recommend more frequent screening and Pap tests.  The above recommendations are the same for women who have or have not gotten the vaccine for human papillomavirus (HPV).  If you had a hysterectomy for a problem that was not cancer or a condition that could lead to cancer, then you no longer need Pap tests. Even if you no longer need a Pap test, a regular exam is a good idea to make sure no other problems are  starting.  If you are between ages 65 and 70, and you have had normal Pap tests going back 10 years, you no longer need Pap tests. Even if you no longer need a Pap test, a regular exam is a good idea to make sure no other problems are starting.  If you have had past treatment for cervical cancer or a condition that could lead to cancer, you need Pap tests and screening for cancer for at least 20 years after your treatment.  If Pap tests have been discontinued, risk factors (such as a new sexual partner) need to be reassessed to determine if screening should be resumed.  The HPV test is an additional test that may be used for cervical cancer screening. The HPV test looks for the virus that can cause the cell changes on the cervix. The cells collected during the Pap test can be tested for HPV. The HPV test could be used to screen women aged 30 years and older, and should   be used in women of any age who have unclear Pap test results. After the age of 30, women should have HPV testing at the same frequency as a Pap test.  Colorectal cancer can be detected and often prevented. Most routine colorectal cancer screening begins at the age of 50 and continues through age 75. However, your caregiver may recommend screening at an earlier age if you have risk factors for colon cancer. On a yearly basis, your caregiver may provide home test kits to check for hidden blood in the stool. Use of a small camera at the end of a tube, to directly examine the colon (sigmoidoscopy or colonoscopy), can detect the earliest forms of colorectal cancer. Talk to your caregiver about this at age 50, when routine screening begins. Direct examination of the colon should be repeated every 5 to 10 years through age 75, unless early forms of pre-cancerous polyps or small growths are found.  Hepatitis C blood testing is recommended for all people born from 1945 through 1965 and any individual with known risks for hepatitis C.  Practice  safe sex. Use condoms and avoid high-risk sexual practices to reduce the spread of sexually transmitted infections (STIs). STIs include gonorrhea, chlamydia, syphilis, trichomonas, herpes, HPV, and human immunodeficiency virus (HIV). Herpes, HIV, and HPV are viral illnesses that have no cure. They can result in disability, cancer, and death. Sexually active women aged 25 and younger should be checked for chlamydia. Older women with new or multiple partners should also be tested for chlamydia. Testing for other STIs is recommended if you are sexually active and at increased risk.  Osteoporosis is a disease in which the bones lose minerals and strength with aging. This can result in serious bone fractures. The risk of osteoporosis can be identified using a bone density scan. Women ages 65 and over and women at risk for fractures or osteoporosis should discuss screening with their caregivers. Ask your caregiver whether you should take a calcium supplement or vitamin D to reduce the rate of osteoporosis.  Menopause can be associated with physical symptoms and risks. Hormone replacement therapy is available to decrease symptoms and risks. You should talk to your caregiver about whether hormone replacement therapy is right for you.  Use sunscreen with sun protection factor (SPF) of 30 or more. Apply sunscreen liberally and repeatedly throughout the day. You should seek shade when your shadow is shorter than you. Protect yourself by wearing long sleeves, pants, a wide-brimmed hat, and sunglasses year round, whenever you are outdoors.  Once a month, do a whole body skin exam, using a mirror to look at the skin on your back. Notify your caregiver of new moles, moles that have irregular borders, moles that are larger than a pencil eraser, or moles that have changed in shape or color.  Stay current with required immunizations.  Influenza. You need a dose every fall (or winter). The composition of the flu vaccine  changes each year, so being vaccinated once is not enough.  Pneumococcal polysaccharide. You need 1 to 2 doses if you smoke cigarettes or if you have certain chronic medical conditions. You need 1 dose at age 65 (or older) if you have never been vaccinated.  Tetanus, diphtheria, pertussis (Tdap, Td). Get 1 dose of Tdap vaccine if you are younger than age 65, are over 65 and have contact with an infant, are a healthcare worker, are pregnant, or simply want to be protected from whooping cough. After that, you need a Td   booster dose every 10 years. Consult your caregiver if you have not had at least 3 tetanus and diphtheria-containing shots sometime in your life or have a deep or dirty wound.  HPV. You need this vaccine if you are a woman age 26 or younger. The vaccine is given in 3 doses over 6 months.  Measles, mumps, rubella (MMR). You need at least 1 dose of MMR if you were born in 1957 or later. You may also need a second dose.  Meningococcal. If you are age 19 to 21 and a first-year college student living in a residence hall, or have one of several medical conditions, you need to get vaccinated against meningococcal disease. You may also need additional booster doses.  Zoster (shingles). If you are age 60 or older, you should get this vaccine.  Varicella (chickenpox). If you have never had chickenpox or you were vaccinated but received only 1 dose, talk to your caregiver to find out if you need this vaccine.  Hepatitis A. You need this vaccine if you have a specific risk factor for hepatitis A virus infection or you simply wish to be protected from this disease. The vaccine is usually given as 2 doses, 6 to 18 months apart.  Hepatitis B. You need this vaccine if you have a specific risk factor for hepatitis B virus infection or you simply wish to be protected from this disease. The vaccine is given in 3 doses, usually over 6 months. Preventive Services / Frequency Ages 19 to 39  Blood  pressure check.** / Every 1 to 2 years.  Lipid and cholesterol check.** / Every 5 years beginning at age 20.  Clinical breast exam.** / Every 3 years for women in their 20s and 30s.  Pap test.** / Every 2 years from ages 21 through 29. Every 3 years starting at age 30 through age 65 or 70 with a history of 3 consecutive normal Pap tests.  HPV screening.** / Every 3 years from ages 30 through ages 65 to 70 with a history of 3 consecutive normal Pap tests.  Hepatitis C blood test.** / For any individual with known risks for hepatitis C.  Skin self-exam. / Monthly.  Influenza immunization.** / Every year.  Pneumococcal polysaccharide immunization.** / 1 to 2 doses if you smoke cigarettes or if you have certain chronic medical conditions.  Tetanus, diphtheria, pertussis (Tdap, Td) immunization. / A one-time dose of Tdap vaccine. After that, you need a Td booster dose every 10 years.  HPV immunization. / 3 doses over 6 months, if you are 26 and younger.  Measles, mumps, rubella (MMR) immunization. / You need at least 1 dose of MMR if you were born in 1957 or later. You may also need a second dose.  Meningococcal immunization. / 1 dose if you are age 19 to 21 and a first-year college student living in a residence hall, or have one of several medical conditions, you need to get vaccinated against meningococcal disease. You may also need additional booster doses.  Varicella immunization.** / Consult your caregiver.  Hepatitis A immunization.** / Consult your caregiver. 2 doses, 6 to 18 months apart.  Hepatitis B immunization.** / Consult your caregiver. 3 doses usually over 6 months. Ages 40 to 64  Blood pressure check.** / Every 1 to 2 years.  Lipid and cholesterol check.** / Every 5 years beginning at age 20.  Clinical breast exam.** / Every year after age 40.  Mammogram.** / Every year beginning at age 40   and continuing for as long as you are in good health. Consult with your  caregiver.  Pap test.** / Every 3 years starting at age 30 through age 65 or 70 with a history of 3 consecutive normal Pap tests.  HPV screening.** / Every 3 years from ages 30 through ages 65 to 70 with a history of 3 consecutive normal Pap tests.  Fecal occult blood test (FOBT) of stool. / Every year beginning at age 50 and continuing until age 75. You may not need to do this test if you get a colonoscopy every 10 years.  Flexible sigmoidoscopy or colonoscopy.** / Every 5 years for a flexible sigmoidoscopy or every 10 years for a colonoscopy beginning at age 50 and continuing until age 75.  Hepatitis C blood test.** / For all people born from 1945 through 1965 and any individual with known risks for hepatitis C.  Skin self-exam. / Monthly.  Influenza immunization.** / Every year.  Pneumococcal polysaccharide immunization.** / 1 to 2 doses if you smoke cigarettes or if you have certain chronic medical conditions.  Tetanus, diphtheria, pertussis (Tdap, Td) immunization.** / A one-time dose of Tdap vaccine. After that, you need a Td booster dose every 10 years.  Measles, mumps, rubella (MMR) immunization. / You need at least 1 dose of MMR if you were born in 1957 or later. You may also need a second dose.  Varicella immunization.** / Consult your caregiver.  Meningococcal immunization.** / Consult your caregiver.  Hepatitis A immunization.** / Consult your caregiver. 2 doses, 6 to 18 months apart.  Hepatitis B immunization.** / Consult your caregiver. 3 doses, usually over 6 months. Ages 65 and over  Blood pressure check.** / Every 1 to 2 years.  Lipid and cholesterol check.** / Every 5 years beginning at age 20.  Clinical breast exam.** / Every year after age 40.  Mammogram.** / Every year beginning at age 40 and continuing for as long as you are in good health. Consult with your caregiver.  Pap test.** / Every 3 years starting at age 30 through age 65 or 70 with a 3  consecutive normal Pap tests. Testing can be stopped between 65 and 70 with 3 consecutive normal Pap tests and no abnormal Pap or HPV tests in the past 10 years.  HPV screening.** / Every 3 years from ages 30 through ages 65 or 70 with a history of 3 consecutive normal Pap tests. Testing can be stopped between 65 and 70 with 3 consecutive normal Pap tests and no abnormal Pap or HPV tests in the past 10 years.  Fecal occult blood test (FOBT) of stool. / Every year beginning at age 50 and continuing until age 75. You may not need to do this test if you get a colonoscopy every 10 years.  Flexible sigmoidoscopy or colonoscopy.** / Every 5 years for a flexible sigmoidoscopy or every 10 years for a colonoscopy beginning at age 50 and continuing until age 75.  Hepatitis C blood test.** / For all people born from 1945 through 1965 and any individual with known risks for hepatitis C.  Osteoporosis screening.** / A one-time screening for women ages 65 and over and women at risk for fractures or osteoporosis.  Skin self-exam. / Monthly.  Influenza immunization.** / Every year.  Pneumococcal polysaccharide immunization.** / 1 dose at age 65 (or older) if you have never been vaccinated.  Tetanus, diphtheria, pertussis (Tdap, Td) immunization. / A one-time dose of Tdap vaccine if you are over   65 and have contact with an infant, are a healthcare worker, or simply want to be protected from whooping cough. After that, you need a Td booster dose every 10 years.  Varicella immunization.** / Consult your caregiver.  Meningococcal immunization.** / Consult your caregiver.  Hepatitis A immunization.** / Consult your caregiver. 2 doses, 6 to 18 months apart.  Hepatitis B immunization.** / Check with your caregiver. 3 doses, usually over 6 months. ** Family history and personal history of risk and conditions may change your caregiver's recommendations. Document Released: 07/09/2001 Document Revised: 08/05/2011  Document Reviewed: 10/08/2010 [employer] Patient Information 2014 [employer].  

## 2012-10-22 ENCOUNTER — Encounter: Payer: Self-pay | Admitting: Family Medicine

## 2012-10-22 LAB — RPR

## 2012-10-22 LAB — HEPATITIS C ANTIBODY: HCV Ab: NEGATIVE

## 2012-10-22 LAB — HIV ANTIBODY (ROUTINE TESTING W REFLEX): HIV: NONREACTIVE

## 2012-10-22 NOTE — Assessment & Plan Note (Signed)
Refill medication

## 2012-10-22 NOTE — Progress Notes (Signed)
  Subjective:     Kim Phelps is a 37 y.o. female and is here for a comprehensive physical exam. The patient reports no problems. Desires STD testing, ppd, and continuation of Depo.  Does not have cycles with Depo.   History   Social History  . Marital Status: Married    Spouse Name: N/A    Number of Children: N/A  . Years of Education: N/A   Occupational History  . Not on file.   Social History Main Topics  . Smoking status: Never Smoker   . Smokeless tobacco: Not on file  . Alcohol Use: No  . Drug Use: No  . Sexually Active: Yes    Birth Control/ Protection: Condom, Injection   Other Topics Concern  . Not on file   Social History Narrative   Lives with 3 yo daughter.   Going to school for EMT.  Unemployed Phlebotomist.   Health Maintenance  Topic Date Due  . Influenza Vaccine  01/25/2013  . Pap Smear  10/22/2015  . Tetanus/tdap  10/06/2021    The following portions of the patient's history were reviewed and updated as appropriate: allergies, current medications, past family history, past medical history, past social history, past surgical history and problem list.  Review of Systems A comprehensive review of systems was negative.   Objective:    BP 121/79  Pulse 103  Temp(Src) 98.6 F (37 C) (Oral)  Ht 5\' 4"  (1.626 m)  Wt 227 lb (102.967 kg)  BMI 38.95 kg/m2 General appearance: alert, cooperative and appears stated age obese Head: Normocephalic, without obvious abnormality, atraumatic Neck: no adenopathy, supple, symmetrical, trachea midline and thyroid not enlarged, symmetric, no tenderness/mass/nodules Back: symmetric, no curvature. ROM normal. No CVA tenderness. Lungs: clear to auscultation bilaterally Breasts: normal appearance, no masses or tenderness Heart: regular rate and rhythm, S1, S2 normal, no murmur, click, rub or gallop Abdomen: soft, non-tender; bowel sounds normal; no masses,  no organomegaly Pelvic: cervix normal in appearance, external  genitalia normal, no adnexal masses or tenderness, no cervical motion tenderness, uterus normal size, shape, and consistency and vagina normal without discharge Extremities: extremities normal, atraumatic, no cyanosis or edema Pulses: 2+ and symmetric Skin: Skin color, texture, turgor normal. No rashes or lesions Lymph nodes: Cervical, supraclavicular, and axillary nodes normal. Neurologic: Grossly normal    Assessment:    Healthy female exam. Obesity      Plan:    STD screen Pap smear Depo PPD Annual blood work. See After Visit Summary for Counseling Recommendations

## 2012-10-22 NOTE — Assessment & Plan Note (Addendum)
Life style modification

## 2012-10-23 ENCOUNTER — Encounter: Payer: Self-pay | Admitting: *Deleted

## 2012-10-23 ENCOUNTER — Ambulatory Visit (INDEPENDENT_AMBULATORY_CARE_PROVIDER_SITE_OTHER): Payer: Medicaid Other | Admitting: *Deleted

## 2012-10-23 DIAGNOSIS — Z111 Encounter for screening for respiratory tuberculosis: Secondary | ICD-10-CM

## 2012-10-23 LAB — TB SKIN TEST: TB Skin Test: NEGATIVE

## 2012-10-27 ENCOUNTER — Encounter: Payer: Self-pay | Admitting: *Deleted

## 2012-11-14 ENCOUNTER — Emergency Department (INDEPENDENT_AMBULATORY_CARE_PROVIDER_SITE_OTHER)
Admission: EM | Admit: 2012-11-14 | Discharge: 2012-11-14 | Disposition: A | Discharge status: Home / Self Care | Payer: Medicaid Other | Source: Home / Self Care | Attending: Family Medicine | Admitting: Family Medicine

## 2012-11-14 ENCOUNTER — Encounter (HOSPITAL_COMMUNITY): Payer: Self-pay | Admitting: *Deleted

## 2012-11-14 DIAGNOSIS — M542 Cervicalgia: Secondary | ICD-10-CM

## 2012-11-14 MED ORDER — CYCLOBENZAPRINE HCL 5 MG PO TABS: 5.0000 mg | ORAL_TABLET | Freq: Three times a day (TID) | ORAL | Status: AC | PRN

## 2012-11-14 MED ORDER — DICLOFENAC POTASSIUM 50 MG PO TABS: 50.0000 mg | ORAL_TABLET | Freq: Three times a day (TID) | ORAL | Status: DC
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
## 2013-01-07 ENCOUNTER — Ambulatory Visit (INDEPENDENT_AMBULATORY_CARE_PROVIDER_SITE_OTHER): Payer: Medicaid Other | Admitting: *Deleted

## 2013-01-07 DIAGNOSIS — Z309 Encounter for contraceptive management, unspecified: Secondary | ICD-10-CM

## 2013-01-07 MED ORDER — MEDROXYPROGESTERONE ACETATE 150 MG/ML IM SUSP
150.0000 mg | Freq: Once | INTRAMUSCULAR | Status: AC
  Administered 2013-01-07: 150 mg via INTRAMUSCULAR

## 2013-01-07 NOTE — Progress Notes (Signed)
Pt here for depo. Depo given LUOQ . Next depo due oct 30 - nov 13 - reminder card given Wyatt Haste, RN-BSN

## 2013-01-21 ENCOUNTER — Ambulatory Visit: Payer: Medicaid Other

## 2013-04-05 ENCOUNTER — Ambulatory Visit: Payer: Medicaid Other

## 2013-04-06 ENCOUNTER — Ambulatory Visit (INDEPENDENT_AMBULATORY_CARE_PROVIDER_SITE_OTHER): Payer: Medicaid Other | Admitting: *Deleted

## 2013-04-06 DIAGNOSIS — Z309 Encounter for contraceptive management, unspecified: Secondary | ICD-10-CM

## 2013-04-06 MED ORDER — MEDROXYPROGESTERONE ACETATE 150 MG/ML IM SUSP
150.0000 mg | Freq: Once | INTRAMUSCULAR | Status: AC
  Administered 2013-04-06: 150 mg via INTRAMUSCULAR

## 2013-04-06 NOTE — Progress Notes (Signed)
Patient in today for depo. Injection given in right ventrogluteal, patient without complaints, site unremarkable. Next depo due January 27 - February 10, patient aware.

## 2013-04-07 ENCOUNTER — Ambulatory Visit: Payer: Medicaid Other

## 2013-06-25 ENCOUNTER — Ambulatory Visit: Payer: Medicaid Other

## 2013-07-01 ENCOUNTER — Ambulatory Visit (INDEPENDENT_AMBULATORY_CARE_PROVIDER_SITE_OTHER): Payer: Medicaid Other | Admitting: Family Medicine

## 2013-07-01 ENCOUNTER — Encounter: Payer: Self-pay | Admitting: Family Medicine

## 2013-07-01 ENCOUNTER — Ambulatory Visit (HOSPITAL_COMMUNITY)
Admission: RE | Admit: 2013-07-01 | Discharge: 2013-07-01 | Disposition: A | Discharge status: Home / Self Care | Payer: Medicaid Other | Source: Ambulatory Visit | Attending: Family Medicine | Admitting: Family Medicine

## 2013-07-01 VITALS — BP 104/66 | HR 86 | Temp 98.1°F | Wt 232.0 lb

## 2013-07-01 DIAGNOSIS — Z3042 Encounter for surveillance of injectable contraceptive: Secondary | ICD-10-CM | POA: Insufficient documentation

## 2013-07-01 DIAGNOSIS — Z9181 History of falling: Secondary | ICD-10-CM | POA: Insufficient documentation

## 2013-07-01 DIAGNOSIS — M25529 Pain in unspecified elbow: Secondary | ICD-10-CM

## 2013-07-01 DIAGNOSIS — Z309 Encounter for contraceptive management, unspecified: Secondary | ICD-10-CM

## 2013-07-01 DIAGNOSIS — Z3049 Encounter for surveillance of other contraceptives: Secondary | ICD-10-CM

## 2013-07-01 DIAGNOSIS — M25521 Pain in right elbow: Secondary | ICD-10-CM

## 2013-07-01 MED ORDER — MEDROXYPROGESTERONE ACETATE 150 MG/ML IM SUSP
150.0000 mg | Freq: Once | INTRAMUSCULAR | Status: AC
  Administered 2013-07-01: 150 mg via INTRAMUSCULAR

## 2013-07-01 MED ORDER — TRAMADOL HCL 50 MG PO TABS: 50.0000 mg | ORAL_TABLET | Freq: Three times a day (TID) | ORAL | Status: AC | PRN

## 2013-07-01 NOTE — Patient Instructions (Signed)
It was nice seeing you Kim Phelps, I am however sorry you are in pain, you might have had an elbow sprain injury, but I will like to obtain and xray to make sure you do not have dislocation or fracture. If these are negative then gentle home elbow exercise might be helpful in addition to the pain medicine I gave you today. F/U sooner if symptom persist.

## 2013-07-01 NOTE — Assessment & Plan Note (Signed)
Likely elbow sprain due to trauma. R/O Fracture or dislocation. Xray of elbow ordered. Tramadol prn pain. Patient instructed on ROM exercise if xray is negative. F/U if no improvement.

## 2013-07-01 NOTE — Assessment & Plan Note (Signed)
Compliant with her follow up shot. Shot given today.

## 2013-07-01 NOTE — Progress Notes (Signed)
Subjective:     Patient ID: Kim Phelps, female   DOB: 03/02/1976, 38 y.o.   MRN: [id number]  HPI Contraception: LMP: 2 yrs ago every since she has been on Depo. No spotting ,no vaginal discharge.last Depo shot was Nov 2014,here for follow up. Elbow pain: Right elbow pain after she fell on Sat at home. She tripped on her daughter's toys on the floor, it was night time.Pain is about 8/10 in severity, aching and thrombing in nature.She has difficulty moving her elbow since she fell. Pain is better in the morning.  Current Outpatient Prescriptions on File Prior to Visit  Medication Sig Dispense Refill  . medroxyPROGESTERone (DEPO-PROVERA) 150 MG/ML injection Inject 150 mg into the muscle every 3 (three) months. [facility]      . Omeprazole 20 MG TBEC Take 1 tablet (20 mg total) by mouth daily.  30 each  5  . cyclobenzaprine (FLEXERIL) 5 MG tablet Take 1 tablet (5 mg total) by mouth 3 (three) times daily as needed for muscle spasms.  30 tablet  0   No current facility-administered medications on file prior to visit.   Past Medical History  Diagnosis Date  . Acid reflux        Review of Systems  Respiratory: Negative.   Cardiovascular: Negative.   Gastrointestinal: Negative.   Musculoskeletal:       Elbow pain  All other systems reviewed and are negative.   Filed Vitals:   07/01/13 1446  BP: 104/66  Pulse: 86  Temp: 98.1 F (36.7 C)  TempSrc: Oral  Weight: 232 lb (105.235 kg)       Objective:   Physical Exam  Nursing note and vitals reviewed. Constitutional: She appears well-developed. No distress.  Cardiovascular: Normal rate, regular rhythm, normal heart sounds and intact distal pulses.   No murmur heard. Pulmonary/Chest: Effort normal and breath sounds normal. No respiratory distress. She has no wheezes.  Abdominal: Soft. Bowel sounds are normal. She exhibits no distension and no mass. There is no tenderness. There is no guarding.  Musculoskeletal:       Right elbow:  She exhibits decreased range of motion and swelling. She exhibits no effusion, no deformity and no laceration. Tenderness found.  ROM of right elbow decreased due to pain.       Assessment:     Contraception management Elbow pain     Plan:     Check problem list

## 2013-08-10 ENCOUNTER — Encounter: Payer: Self-pay | Admitting: Family Medicine

## 2013-08-10 ENCOUNTER — Ambulatory Visit (INDEPENDENT_AMBULATORY_CARE_PROVIDER_SITE_OTHER): Payer: Medicaid Other | Admitting: Family Medicine

## 2013-08-10 VITALS — BP 117/74 | HR 88 | Temp 98.8°F | Wt 230.0 lb

## 2013-08-10 DIAGNOSIS — M25521 Pain in right elbow: Secondary | ICD-10-CM

## 2013-08-10 DIAGNOSIS — M25529 Pain in unspecified elbow: Secondary | ICD-10-CM

## 2013-08-10 MED ORDER — METHYLPREDNISOLONE ACETATE 40 MG/ML IJ SUSP
40.0000 mg | Freq: Once | INTRAMUSCULAR | Status: AC
  Administered 2013-08-10: 40 mg via INTRA_ARTICULAR

## 2013-08-10 NOTE — Progress Notes (Signed)
Subjective:     Patient ID: Kim Phelps, female   DOB: 02/14/1976, 38 y.o.   MRN: [id number]  HPI:  Presents with a 2 month history of R elbow pain.  The pain is worse with arm extension, supination, and wrist flexion.  Range of motion is preserved.  Pain is localized above both the lateral and medial epicondyles, but it is worse on the lateral side.  Got X-ray done here about a month ago, which did not show any abnormalities.  Manages the pain with Naproxen, which helps a lot (2 in the morning, 1 at night).  Concerned about lingering pain and chronic use of Alleve.   Review of Systems Denies radiation of pain to hand.  Denies fever, chills, trauma to the area.  Denies significant swelling    Objective:   Physical Exam Preserved grip strength.  Pain on wrist flexion, arm extension and arm supination.  Direct tenderness over the medial and lateral epicondyles.      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  Preserved range of motion, lack of systemic signs, lack of swelling, and no radiation support the diagnosis.  Continue to manage with NSAIDs, but do a steroid joint injection today.          Plan:     1. Steroid injection:      The risks and benefits of the procedure were explained, and the patient consented with written consent.  Mixture: 40 mL of DepoMedrol and 5 mL of anesthetic.  Cleaned the area with chlorhexidine and then alcohol.  Injected the mixture into the lateral joint space.  No bleeding during the procedure.   2. Have patient came back in any fever, increased pain, redness, or sign of local infection.  Attending Addendum  I examined the patient and discussed the assessment and plan with Student  Dr. Ross MarcusSherwood. I have reviewed the note and agree.  Briefly, 38 yo F with R elbow pain, persistent, progressive. S/p x-rays: normal. 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 Taking OTC NSAID  which relieves pain temporarily. No elbow redness, swelling or fever.   BP 117/74  Pulse 88  Temp(Src) 98.8 F (37.1 C) (Oral)  Wt 230 lb (104.327 kg) General appearance: alert, cooperative, no distress. R elbow: full flexion, lack 5-10 degrees of extension. Tenderness to palpation of lateral and medial epicondyle, lateral mostly. No swelling or fluctuance. Pain also reproduced with supination.   Reviewed R elbow x-ray from 07/01/13 Steroid injection done by medical student under my direct supervision as per above.   Dessa PhiFUNCHES,JOSALYN, MD [facility]

## 2013-08-10 NOTE — Patient Instructions (Addendum)
Ms. Kim Phelps,  Thank you for coming in today.   Your exam is consistent with lateral and medial epicondylitis. I treated you for lateral epicondylitis with a steroid injection today.  Please rest your elbow and ice it this evening, 20 minutes.   Start rehab tomorrow.  If you develop redness, worsening pain, swelling at the elbow please call.   Dr. Armen PickupFunches

## 2013-08-11 NOTE — Assessment & Plan Note (Signed)
A:  Your exam is consistent with lateral and medial epicondylitis. P: I treated you for lateral epicondylitis with a steroid injection today.  Please rest your elbow and ice it this evening, 20 minutes.  Start rehab tomorrow: exercises from sports medicine patient advisory. Advised patient to do them twice daily.  If you develop redness, worsening pain, swelling at the elbow please call.

## 2013-09-16 ENCOUNTER — Ambulatory Visit (INDEPENDENT_AMBULATORY_CARE_PROVIDER_SITE_OTHER): Payer: Medicaid Other | Admitting: *Deleted

## 2013-09-16 DIAGNOSIS — Z3049 Encounter for surveillance of other contraceptives: Secondary | ICD-10-CM

## 2013-09-16 DIAGNOSIS — Z23 Encounter for immunization: Secondary | ICD-10-CM

## 2013-09-16 DIAGNOSIS — Z3042 Encounter for surveillance of injectable contraceptive: Secondary | ICD-10-CM

## 2013-09-16 MED ORDER — MEDROXYPROGESTERONE ACETATE 150 MG/ML IM SUSP
150.0000 mg | Freq: Once | INTRAMUSCULAR | Status: AC
  Administered 2013-09-16: 150 mg via INTRAMUSCULAR

## 2013-09-16 NOTE — Progress Notes (Signed)
   Pt in for Depo Provera injection.  Pt tolerated Depo injection. Depo given Right upper outer quadrant.  Next injection due July 9 - December 16, 2013.  Reminder card given.  Pt also received last dose of Hep B vaccine.  Clovis PuMartin, Tamika L, RN

## 2013-12-14 ENCOUNTER — Ambulatory Visit: Payer: Medicaid Other

## 2014-09-15 IMAGING — CR DG ELBOW COMPLETE 3+V*R*
4 series · 4 of 4 positions shown · non-contrast
Comparison: None.

CLINICAL DATA: Fall 5 days ago.  Posterior right elbow pain.

EXAM:
RIGHT ELBOW - COMPLETE 3+ VIEW

[x elbow joint ap right]
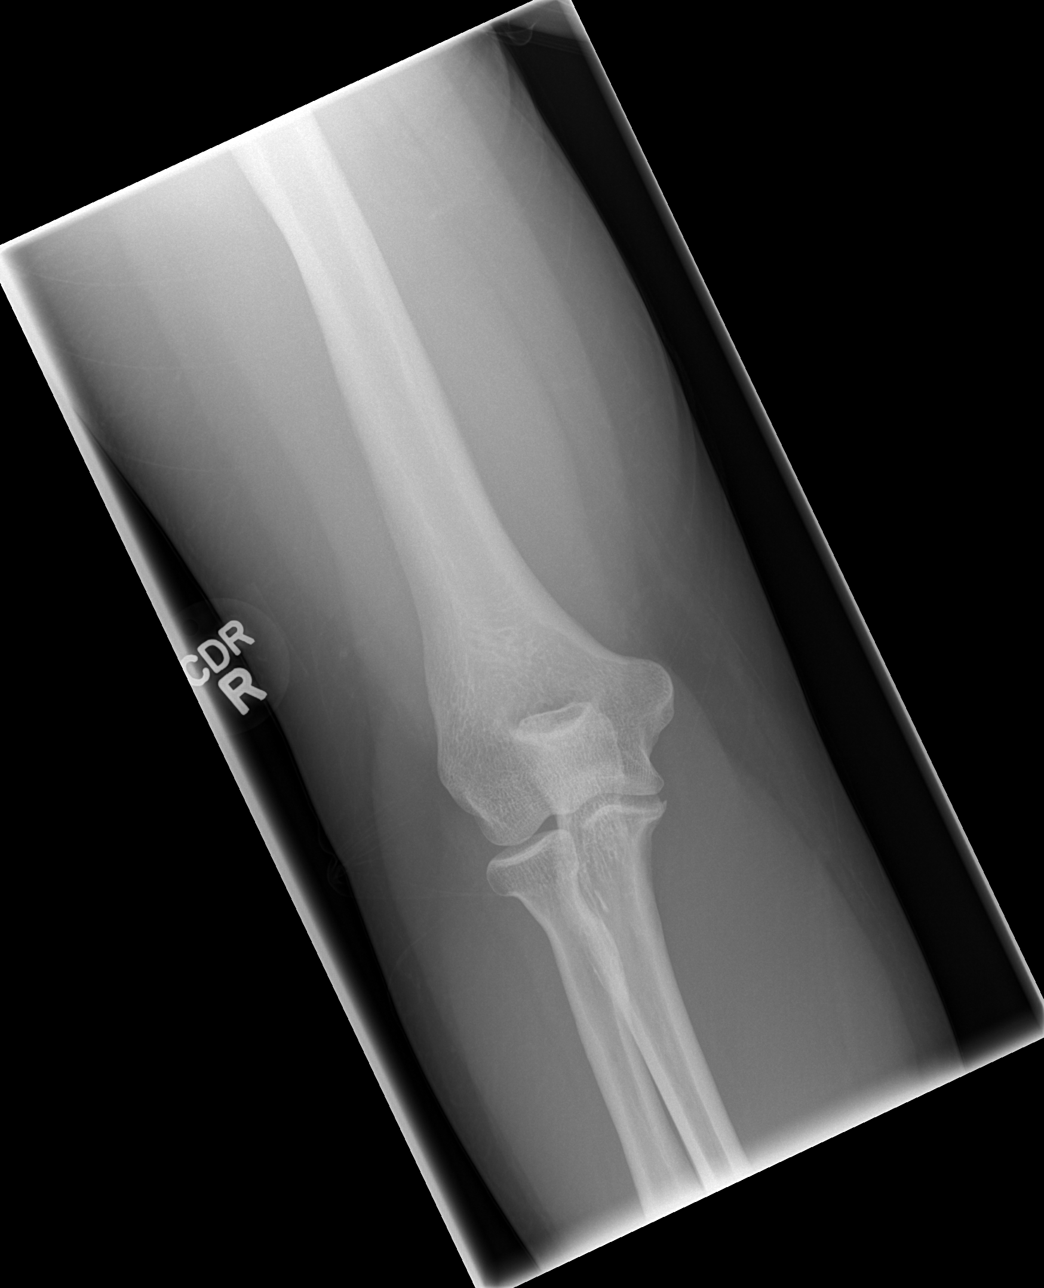

[x elbow joint obl. right (1 of 2)]
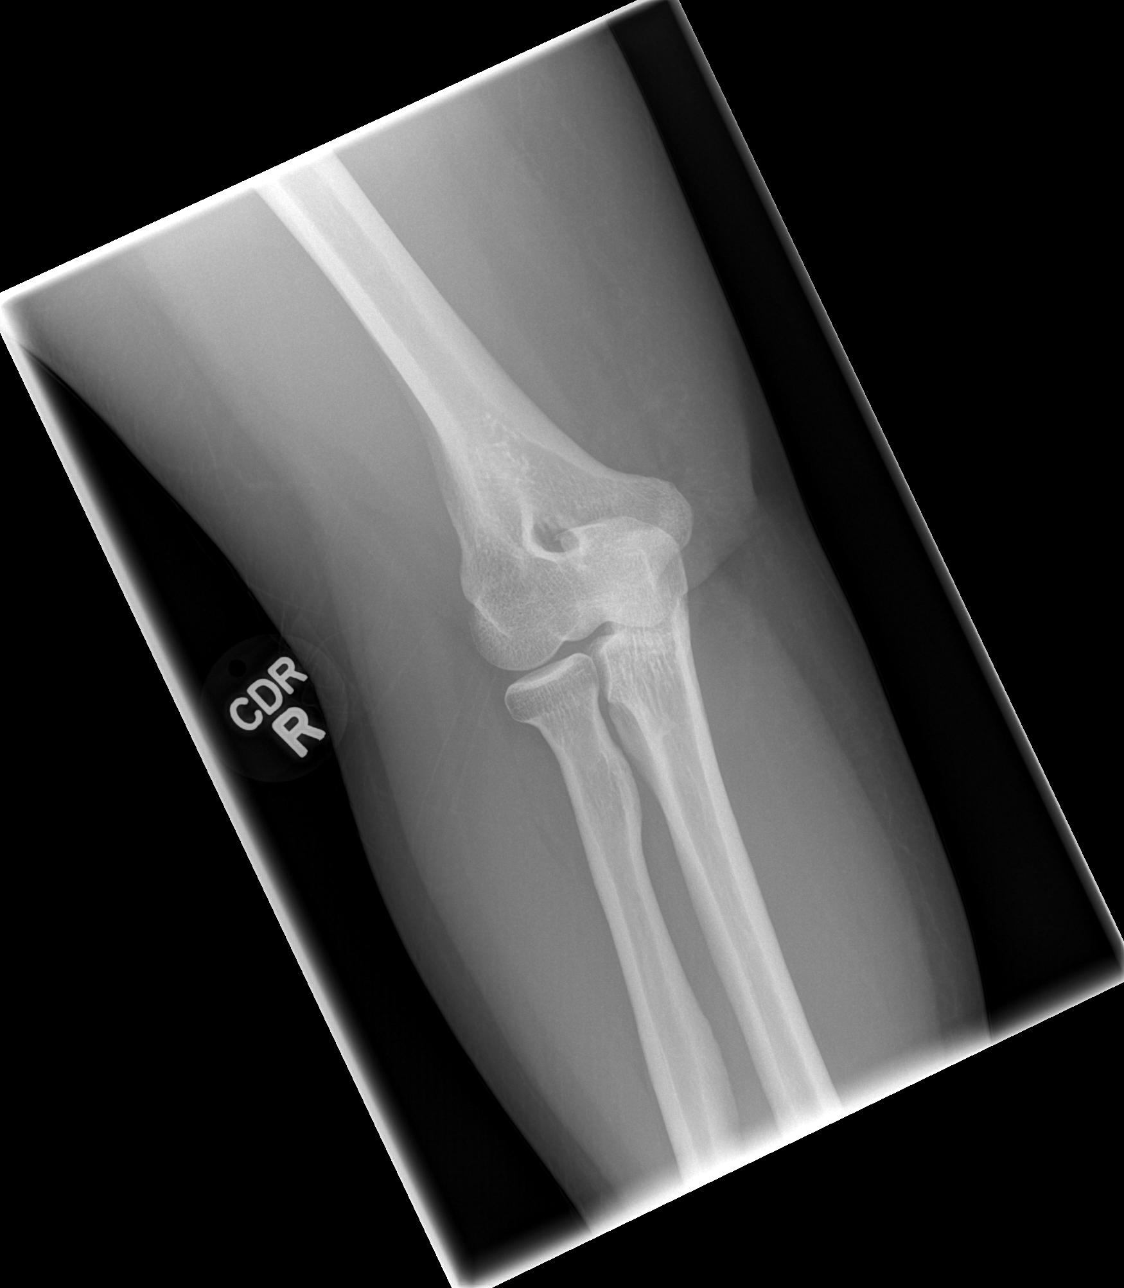

[x elbow joint obl. right (2 of 2)]
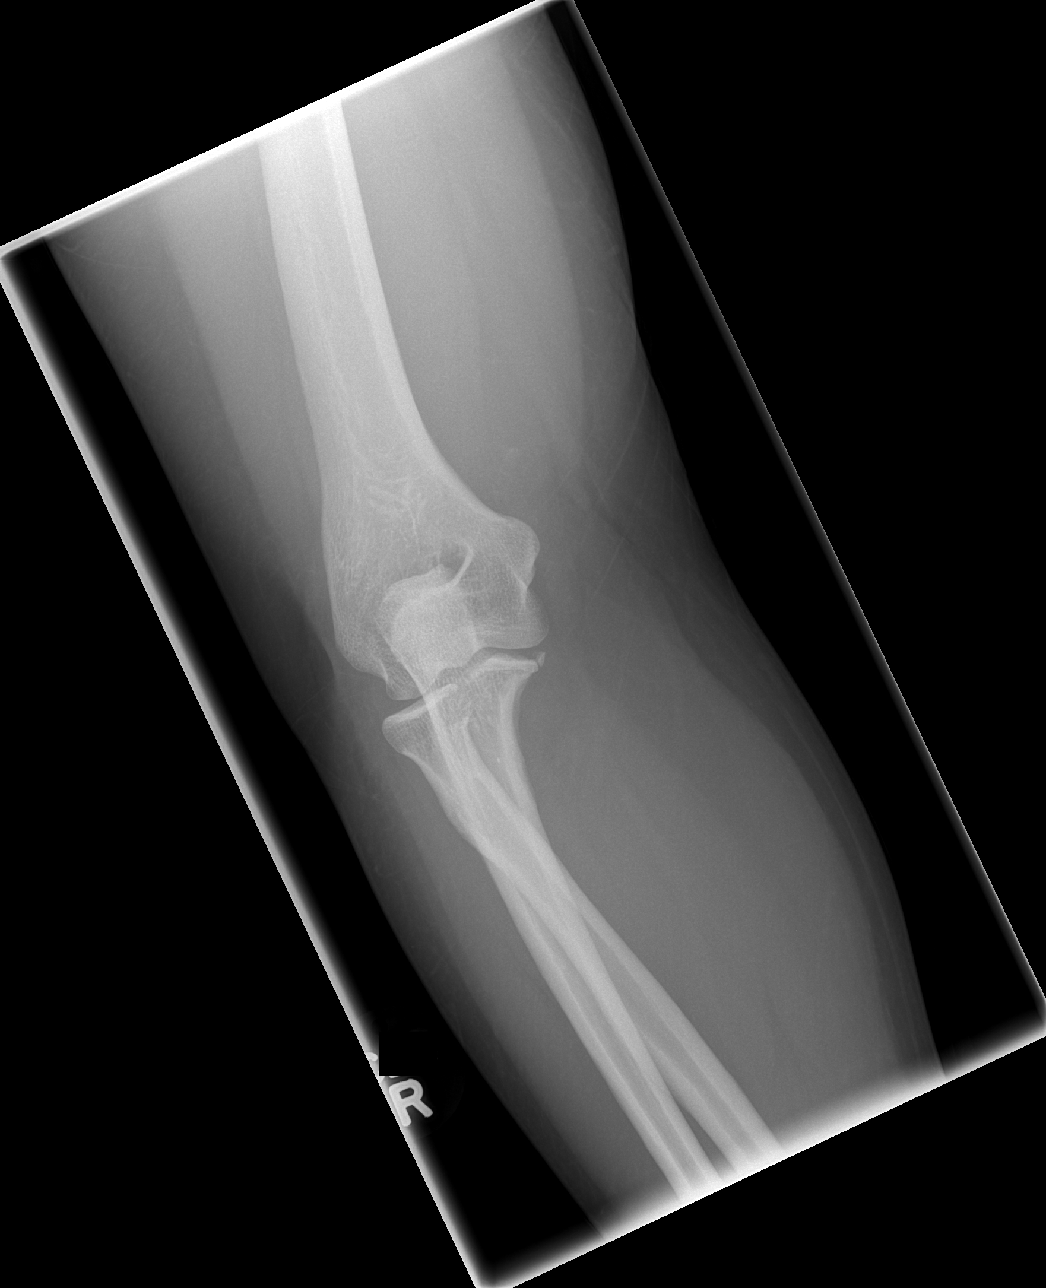

[x elbow joint lat right]
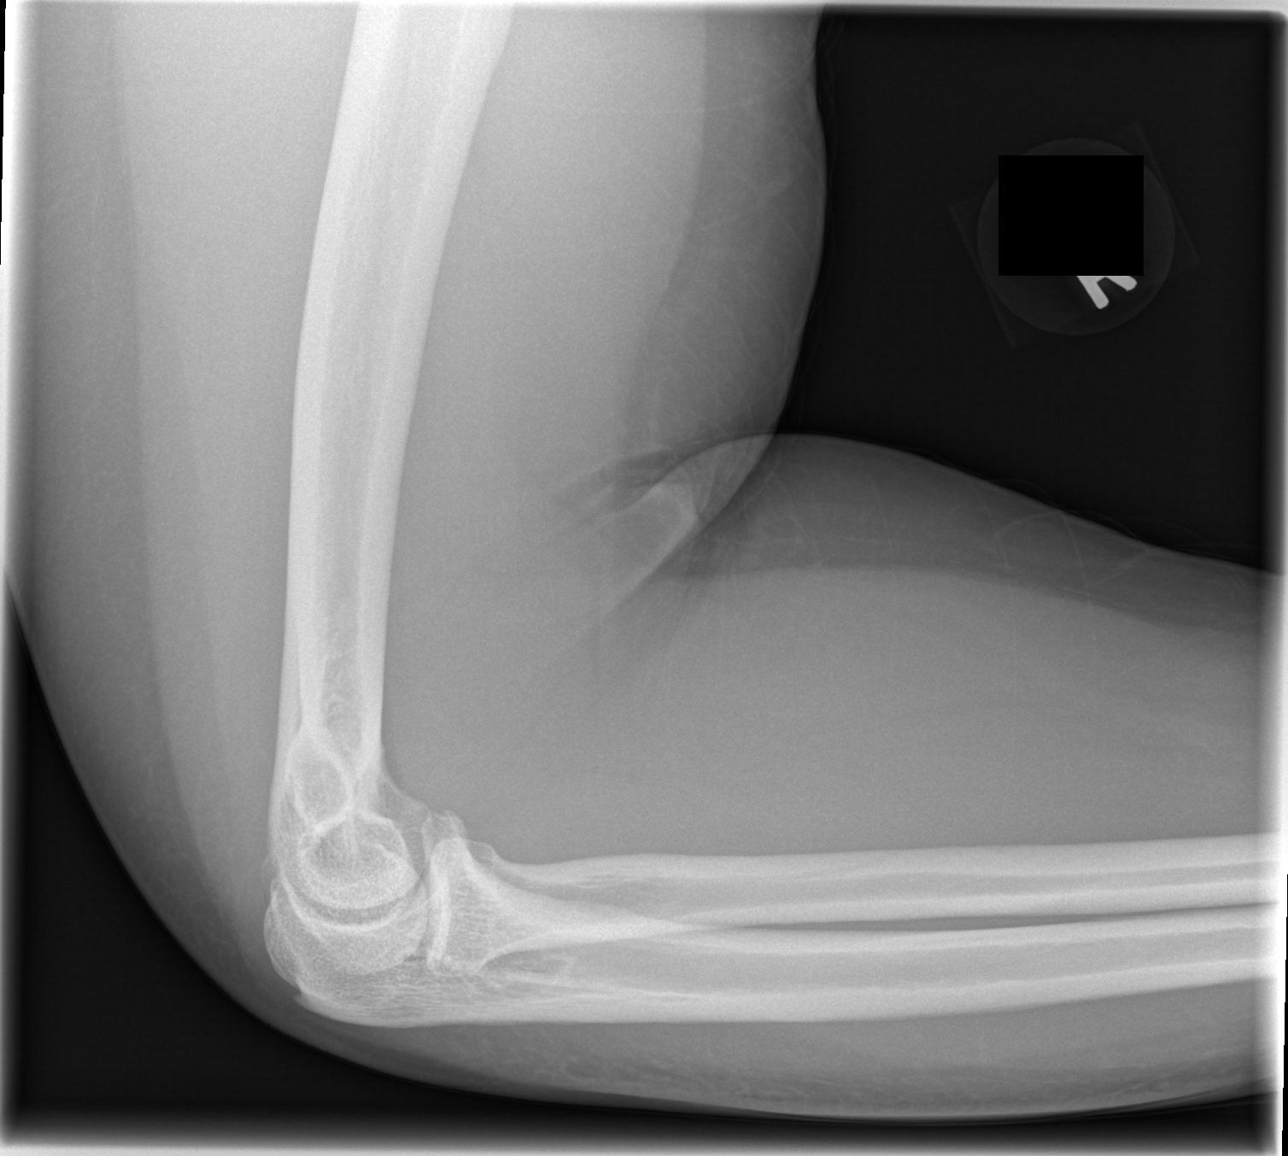

[4 of 4 positions shown; findings below may reference images not displayed]

FINDINGS: No fracture. The elbow joint is normally space and aligned. No joint
effusion. Soft tissues are unremarkable.
IMPRESSION: Negative.
